# Patient Record
Sex: Female | Born: 1965 | Race: White | Hispanic: Yes | Marital: Married
Health system: Southern US, Community
[De-identification: ages and names within clinical notes are randomized; demographics above are authoritative.]

---

## 2018-03-18 ENCOUNTER — Other Ambulatory Visit: Payer: Self-pay | Admitting: Obstetrics & Gynecology

## 2018-03-18 DIAGNOSIS — Z1231 Encounter for screening mammogram for malignant neoplasm of breast: Secondary | ICD-10-CM

## 2018-03-22 ENCOUNTER — Ambulatory Visit
Admission: RE | Admit: 2018-03-22 | Discharge: 2018-03-22 | Disposition: A | Discharge status: Home / Self Care | Payer: 59 | Source: Ambulatory Visit | Attending: Obstetrics & Gynecology | Admitting: Obstetrics & Gynecology

## 2018-03-22 ENCOUNTER — Encounter: Payer: Self-pay | Admitting: Radiology

## 2018-03-22 DIAGNOSIS — Z1231 Encounter for screening mammogram for malignant neoplasm of breast: Secondary | ICD-10-CM | POA: Diagnosis not present

## 2018-04-03 ENCOUNTER — Other Ambulatory Visit: Payer: Self-pay | Admitting: *Deleted

## 2018-04-03 ENCOUNTER — Inpatient Hospital Stay
Admission: RE | Admit: 2018-04-03 | Discharge: 2018-04-03 | Disposition: A | Discharge status: Home / Self Care | Payer: Self-pay | Source: Ambulatory Visit | Attending: *Deleted | Admitting: *Deleted

## 2018-04-03 DIAGNOSIS — Z9289 Personal history of other medical treatment: Secondary | ICD-10-CM

## 2020-02-16 ENCOUNTER — Ambulatory Visit: Payer: 59 | Attending: Internal Medicine

## 2020-02-16 DIAGNOSIS — Z23 Encounter for immunization: Secondary | ICD-10-CM | POA: Insufficient documentation

## 2020-02-16 NOTE — Progress Notes (Signed)
   Covid-19 Vaccination Clinic  Name:  Heidi Contreras    MRN: [id number] DOB: [birth year]  02/16/2020  [name] was observed post Covid-19 immunization for 15 minutes without incidence. She was provided with Vaccine Information Sheet and instruction to access the V-Safe system.   [name] was instructed to call 911 with any severe reactions post vaccine: Marland Kitchen Difficulty breathing  . Swelling of your face and throat  . A fast heartbeat  . A bad rash all over your body  . Dizziness and weakness    Immunizations Administered    Name Date Dose VIS Date Route   Moderna COVID-19 Vaccine 02/16/2020 11:17 AM 0.5 mL 11/25/2019 Intramuscular   Manufacturer: Moderna   Lot: [id number]   NDC: 03704-888-91

## 2020-03-16 ENCOUNTER — Ambulatory Visit: Payer: 59 | Attending: Internal Medicine

## 2020-03-16 DIAGNOSIS — Z23 Encounter for immunization: Secondary | ICD-10-CM

## 2020-03-16 NOTE — Progress Notes (Signed)
   Covid-19 Vaccination Clinic  Name:  Heidi Contreras    MRN: [id number] DOB: 07-15-1966  03/16/2020  Heidi Contreras was observed post Covid-19 immunization for 15 minutes without incident. She was provided with Vaccine Information Sheet and instruction to access the V-Safe system.   Heidi Contreras was instructed to call 911 with any severe reactions post vaccine: Marland Kitchen Difficulty breathing  . Swelling of face and throat  . A fast heartbeat  . A bad rash all over body  . Dizziness and weakness   Immunizations Administered    Name Date Dose VIS Date Route   Moderna COVID-19 Vaccine 03/16/2020  2:23 PM 0.5 mL 11/25/2019 Intramuscular   Manufacturer: Gala Murdoch   Lot: [id number]   NDC: 44315-400-86

## 2021-12-21 ENCOUNTER — Other Ambulatory Visit: Payer: Self-pay | Admitting: Obstetrics

## 2021-12-21 DIAGNOSIS — Z1231 Encounter for screening mammogram for malignant neoplasm of breast: Secondary | ICD-10-CM

## 2022-01-31 ENCOUNTER — Other Ambulatory Visit: Payer: Self-pay

## 2022-01-31 ENCOUNTER — Ambulatory Visit
Admission: RE | Admit: 2022-01-31 | Discharge: 2022-01-31 | Disposition: A | Discharge status: Home / Self Care | Payer: BC Managed Care – PPO | Source: Ambulatory Visit | Attending: Obstetrics | Admitting: Obstetrics

## 2022-01-31 DIAGNOSIS — Z1231 Encounter for screening mammogram for malignant neoplasm of breast: Secondary | ICD-10-CM | POA: Diagnosis not present

## 2022-04-03 ENCOUNTER — Ambulatory Visit: Payer: 59 | Admitting: Dermatology

## 2023-01-08 IMAGING — MG MM DIGITAL SCREENING BILAT W/ TOMO AND CAD
8 series · 9 of 24 positions shown · non-contrast
Comparison: Previous exam(s).

CLINICAL DATA: Screening.

EXAM:
DIGITAL SCREENING BILATERAL MAMMOGRAM WITH TOMOSYNTHESIS AND CAD
TECHNIQUE: Bilateral screening digital craniocaudal and mediolateral oblique
mammograms were obtained. Bilateral screening digital breast
tomosynthesis was performed. The images were evaluated with
computer-aided detection.

[L CC synth-2D]
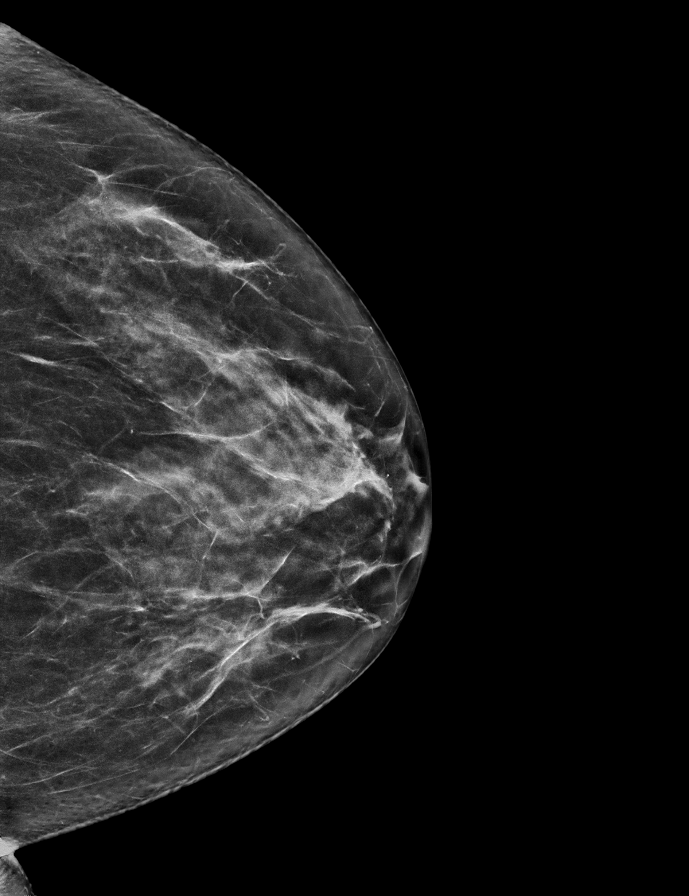

[L MLO synth-2D]
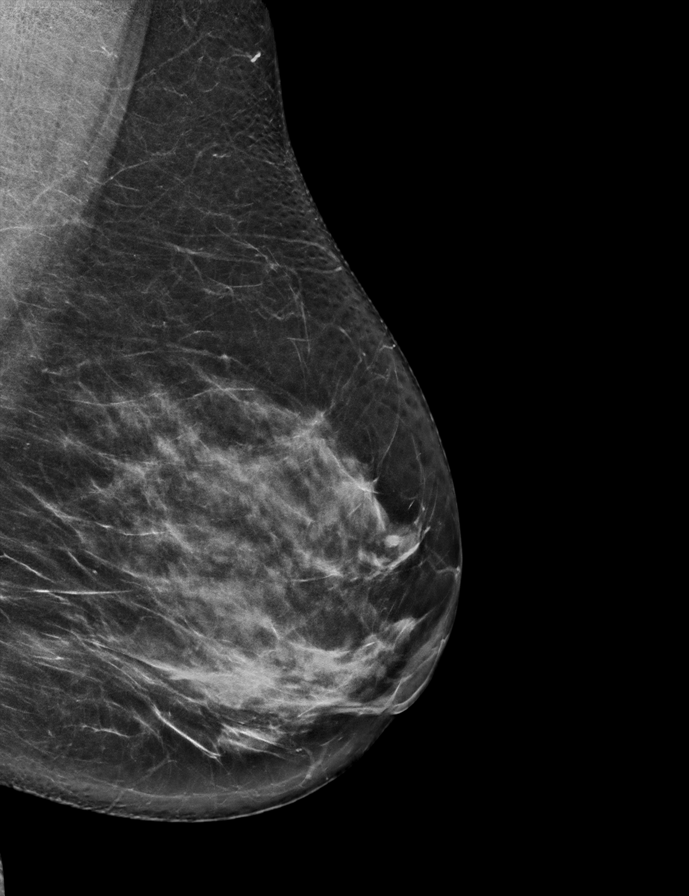

[R CC synth-2D]
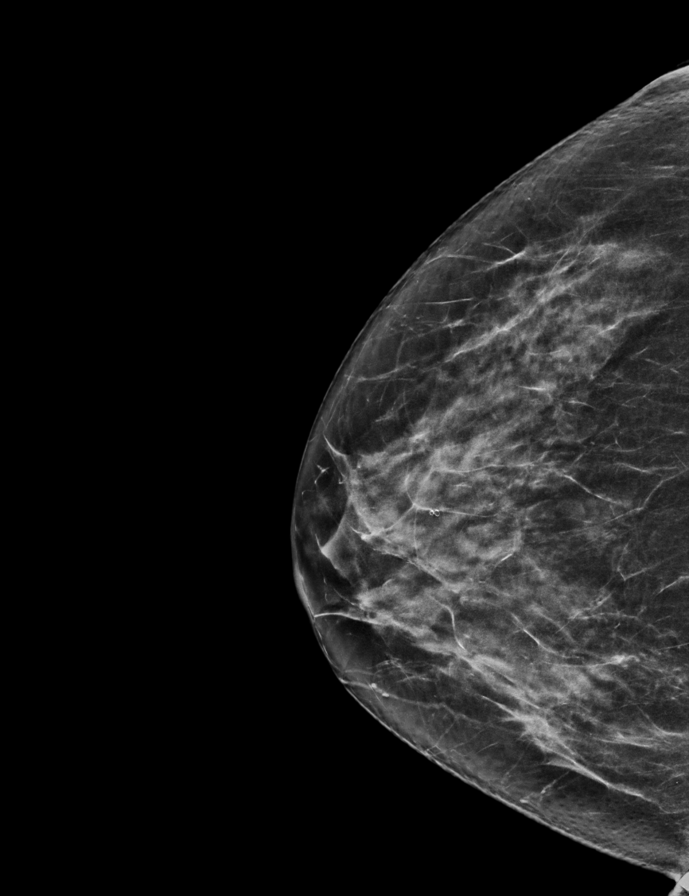

[R MLO synth-2D]
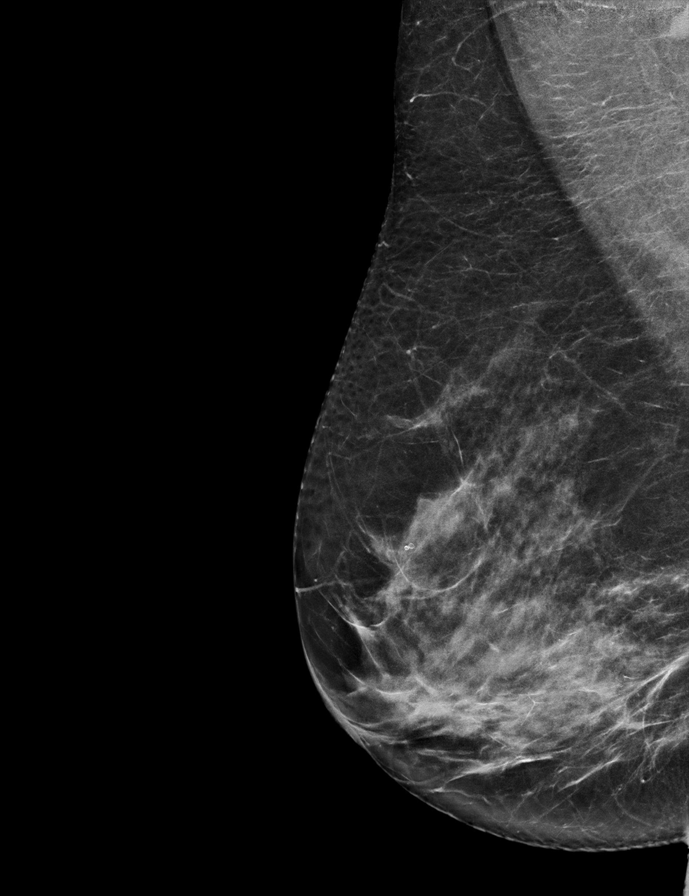

[R MLO tomo · 2 of 69 frames shown]
[frame 23/69]
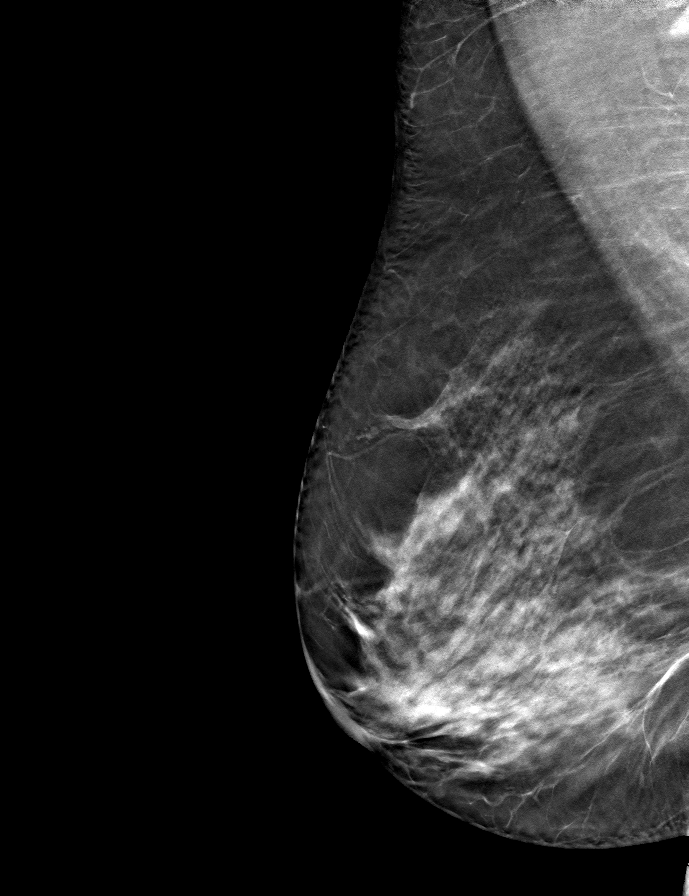
[frame 35/69]
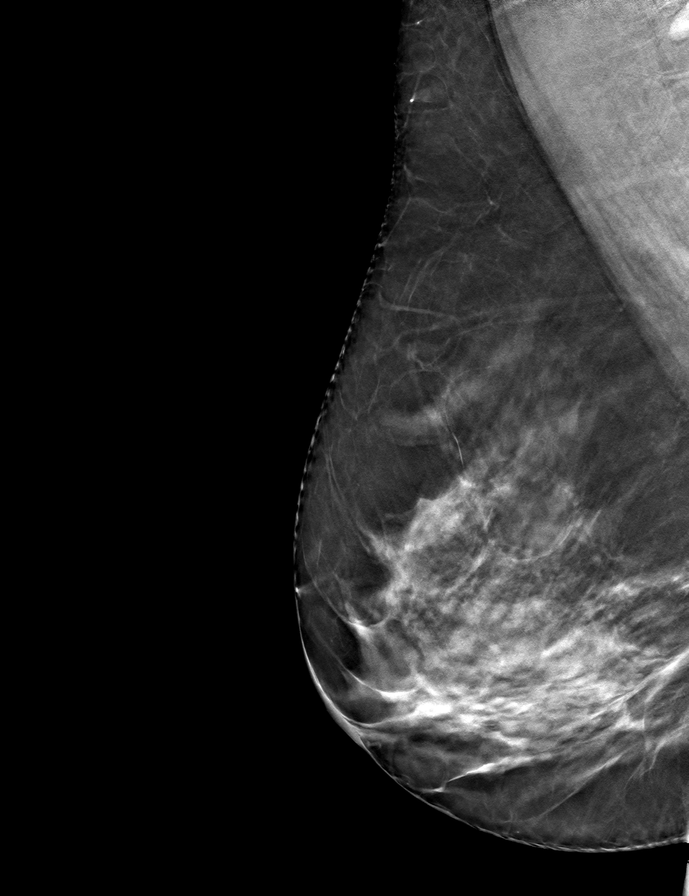

[L MLO tomo · tomo slice 37/72.0]
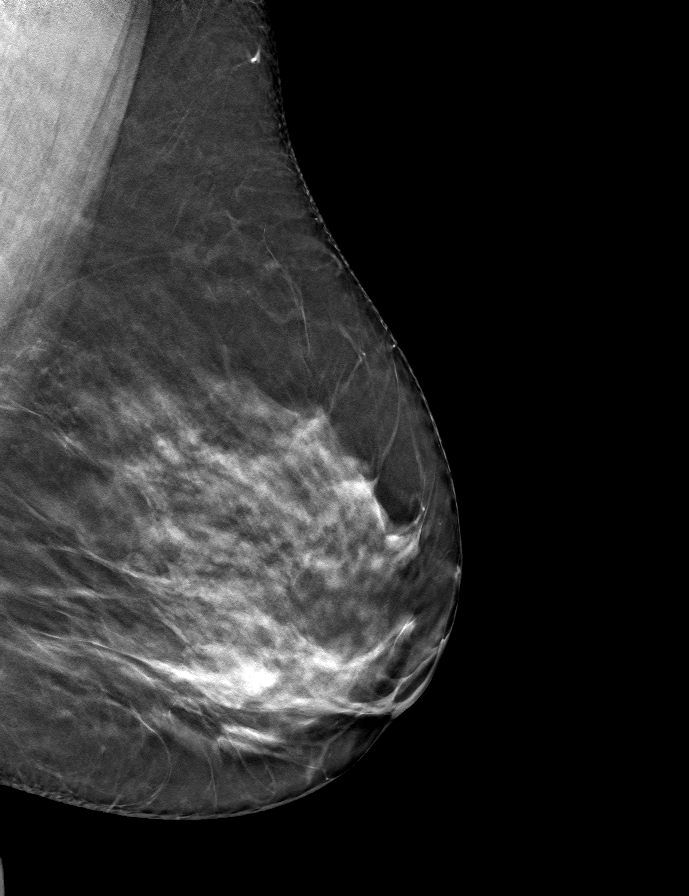

[L CC tomo · tomo slice 33/66.0]
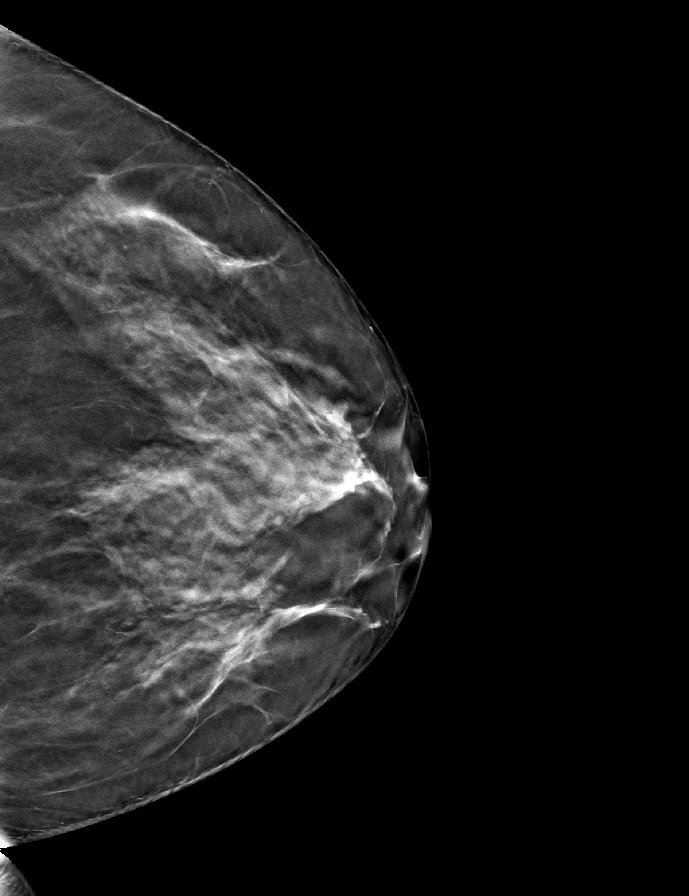

[R CC tomo · tomo slice 33/64.0]
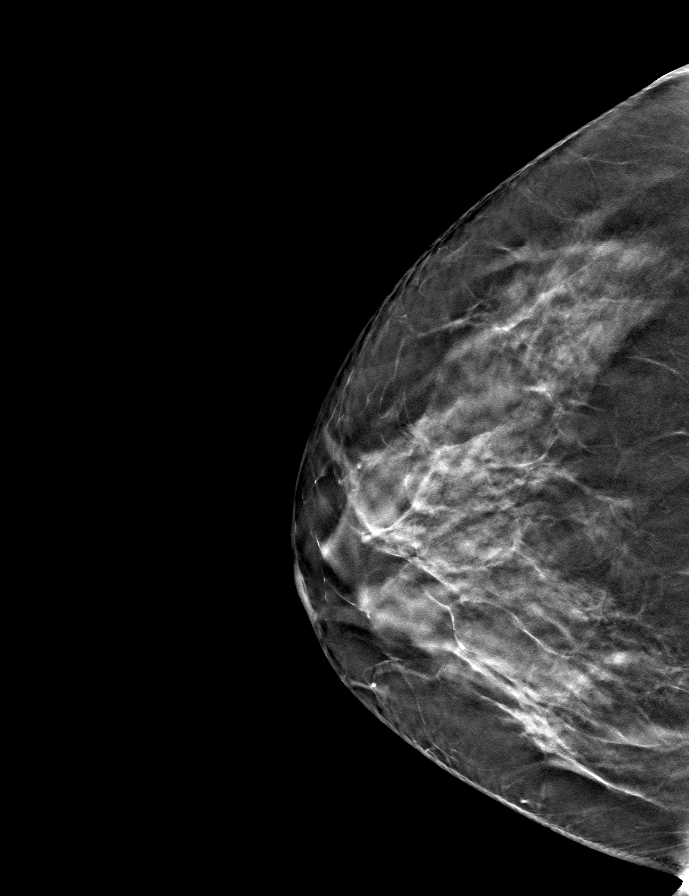

[9 of 24 positions shown; findings below may reference images not displayed]

ACR Breast Density Category c: The breast tissue is heterogeneously
dense, which may obscure small masses.
FINDINGS: There are no findings suspicious for malignancy.
IMPRESSION: No mammographic evidence of malignancy. A result letter of this
screening mammogram will be mailed directly to the patient.

RECOMMENDATION:
Screening mammogram in one year. (Code:[id number])

BI-RADS CATEGORY  1: Negative.
